# Patient Record
Sex: Male | Born: 1955 | Race: White | Hispanic: No | Marital: Married | Smoking: Never smoker
Health system: Southern US, Community
[De-identification: ages and names within clinical notes are randomized; demographics above are authoritative.]

## PROBLEM LIST (undated history)

## (undated) DIAGNOSIS — I7771 Dissection of carotid artery: Secondary | ICD-10-CM

## (undated) DIAGNOSIS — I10 Essential (primary) hypertension: Secondary | ICD-10-CM

## (undated) DIAGNOSIS — H269 Unspecified cataract: Secondary | ICD-10-CM

## (undated) DIAGNOSIS — E785 Hyperlipidemia, unspecified: Secondary | ICD-10-CM

## (undated) HISTORY — DX: Essential (primary) hypertension: I10

## (undated) HISTORY — DX: Hyperlipidemia, unspecified: E78.5

## (undated) HISTORY — DX: Unspecified cataract: H26.9

## (undated) HISTORY — PX: ROTATOR CUFF REPAIR: SHX139

## (undated) HISTORY — DX: Dissection of carotid artery: I77.71

## (undated) HISTORY — PX: OTHER SURGICAL HISTORY: SHX169

---

## 2011-04-07 ENCOUNTER — Inpatient Hospital Stay (HOSPITAL_COMMUNITY)
Admission: AD | Admit: 2011-04-07 | Discharge: 2011-04-08 | DRG: 300 | Disposition: A | Discharge status: Home / Self Care | Payer: PRIVATE HEALTH INSURANCE | Source: Other Acute Inpatient Hospital | Attending: Internal Medicine | Admitting: Internal Medicine

## 2011-04-07 DIAGNOSIS — Z7901 Long term (current) use of anticoagulants: Secondary | ICD-10-CM

## 2011-04-07 DIAGNOSIS — Z888 Allergy status to other drugs, medicaments and biological substances status: Secondary | ICD-10-CM

## 2011-04-07 DIAGNOSIS — I7771 Dissection of carotid artery: Principal | ICD-10-CM | POA: Diagnosis present

## 2011-04-07 DIAGNOSIS — I63239 Cerebral infarction due to unspecified occlusion or stenosis of unspecified carotid arteries: Secondary | ICD-10-CM

## 2011-04-07 DIAGNOSIS — E538 Deficiency of other specified B group vitamins: Secondary | ICD-10-CM | POA: Diagnosis present

## 2011-04-07 DIAGNOSIS — G909 Disorder of the autonomic nervous system, unspecified: Secondary | ICD-10-CM | POA: Diagnosis present

## 2011-04-07 DIAGNOSIS — Z7982 Long term (current) use of aspirin: Secondary | ICD-10-CM

## 2011-04-07 DIAGNOSIS — I10 Essential (primary) hypertension: Secondary | ICD-10-CM | POA: Diagnosis present

## 2011-04-08 ENCOUNTER — Inpatient Hospital Stay (HOSPITAL_COMMUNITY): Payer: PRIVATE HEALTH INSURANCE

## 2011-04-08 LAB — PROTIME-INR: INR: 0.93 (ref 0.00–1.49)

## 2011-04-08 LAB — CBC
HCT: 42.1 % (ref 39.0–52.0)
MCHC: 35.9 g/dL (ref 30.0–36.0)
Platelets: 289 10*3/uL (ref 150–400)
RDW: 12.6 % (ref 11.5–15.5)
WBC: 10.6 10*3/uL — ABNORMAL HIGH (ref 4.0–10.5)

## 2011-04-08 LAB — BASIC METABOLIC PANEL
BUN: 24 mg/dL — ABNORMAL HIGH (ref 6–23)
Chloride: 104 mEq/L (ref 96–112)
GFR calc Af Amer: >60 mL/min (ref >60)
GFR calc non Af Amer: >60 mL/min (ref >60)
Potassium: 3.9 mEq/L (ref 3.5–5.1)
Sodium: 140 mEq/L (ref 135–145)

## 2011-04-08 LAB — HEPARIN LEVEL (UNFRACTIONATED): Heparin Unfractionated: 0.47 IU/mL (ref 0.30–0.70)

## 2011-04-08 MED ORDER — IOHEXOL 350 MG/ML SOLN
50.0000 mL | Freq: Once | INTRAVENOUS | Status: AC | PRN
  Administered 2011-04-08: 50 mL via INTRAVENOUS

## 2011-04-08 NOTE — Op Note (Signed)
NAMEALDIN, DREES NO.:  [id number]  MEDICAL RECORD NO.:  [id number]  LOCATION:  3032                         FACILITY:  [facility]  PHYSICIAN:  Fransisco Hertz, MD       DATE OF BIRTH:  [birth year]  DATE OF PROCEDURE:  04/08/2011 DATE OF DISCHARGE:                              OPERATIVE REPORT   REQUESTING PHYSICIAN:  Dr. Betti Cruz one of the hospitalist at [facility].  REASON FOR CONSULTATION:  Possible right internal carotid artery dissection.  HISTORY OF PRESENT ILLNESS:  This is a 55 year old gentleman who a few days ago was having what he thought was upper respiratory symptoms on his right side.  He went to see a nurse and on some screening examination was found to have unequal pupils who was then sent to an ophthalmologist for evaluation.  They drew a ESR to rule-out a temporal arteritis.  At the same time, he had some headache on the right side and also a constricted pupil on the right side with some eyelid droop.  He notessince then he has noted some improvement in his eyelid droop and also variable amount of constriction in his right eye.  He does note his primary care physician also saw him to evaluate his right ear and is unclear exactly the diagnosis that his primary care physician decided on, but gave him some steroids, it appears then he was sent to the hospital for CT and eventual MRI.  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  I felt that as there was no vascular surgeon at the outside hospital, he would be appropriately transferred here to a medical service for further evaluation.  At this point, he denies any pain per se only a sense of fullness on the right side of his face and does not note any problems of his vision.  He denies any stroke or TIA symptoms, denies any history of monocular blindness or amaurosis fugax.  He denies  any facial droop being other than this recent episode and denies any hemiplegia.  He also denies any episodes previously of any expressive or receptive aphasia.  He does note his risk factor for atherosclerotic disease include hypertension, otherwise he denies any other medical problems.  PAST MEDICAL HISTORY:  Hypertension and B12 deficiency.  PAST SURGICAL HISTORY:  None.  MEDICATIONS:  B12 injections and Diovan and hydrochlorothiazide, aspirin, multivitamin and fish oil.  ALLERGIES:  LIPITOR which causes myalgia.  FAMILY HISTORY:  Father died of complications from cirrhosis.  Mother is currently healthy.  REVIEW OF SYSTEMS:  Was as listed above.  Otherwise, noted to be negative.  PHYSICAL EXAMINATION:  VITAL SIGNS:  His temperature was 97.9 with a blood pressure 122/73, heart rate of 57, respirations were 18 and satting 97% on room air. GENERAL:  Well-developed, well-nourished, in no apparent distress, alert and oriented x3. HEAD:  Normocephalic, atraumatic. ENT:  Oropharynx without any erythema or exudate.  Nares without any draining.  Ear is grossly intact. NECK:  Supple neck without any nuchal rigidity.  No JVD.  EYES:  Pupils were reactive to light both indirect and direct pupillary reactions.  The right eye is about 3-mm.  The left eye is about 5-mm. However, testing is appropriate and equal in both sides with testing. Extraocular movements were intact.  There is a slight eyelid droop on the right side. PULMONARY:  Symmetric expansion.  Good air movement.  No rales, rhonchi or wheezing. CARDIAC:  Regular rate and rhythm.  Normal S1 and S2.  No murmurs, rubs, thrills or gallops. VASCULAR:  Pelvises were palpable throughout all extremities.  Abdominal aorta could not be palpated.  Both carotids were palpable without any obvious bruits. ABDOMEN:  He had a soft abdomen nontender and nondistended.  No guarding, no rebound and no hepatosplenomegaly.  No costovertebral  angle tenderness.  No obvious masses. MUSCULOSKELETAL:  He had symmetric strength 5/5 throughout.  There is no ischemic changes in the extremity. NEUROLOGIC:  Cranial nerves II through XII were grossly intact.  The pupillary findings are as listed above.  The motor strength was intact as described above.  Sensation was grossly intact in all extremities. PSYCH:  Mood and affect were appropriate for his clinical situation. Judgment appears to be intact. SKIN:  No obvious rashes.  Extremities were as listed above. LYMPHATIC:  No cervical, axillary or inguinal lymphadenopathy were palpable.  I looked at the outside MRI which shows no obvious focal findings of stroke.  Also the MRA, there is underfilling of the right side and I can see the impression of possibly a dissection.  LABORATORY STUDIES:  CBC; white count of 10.6.  An H and H of 15.1 and 42.1, platelet count of 289.  Chemistries; sodium of 140, potassium of 3.9, chloride of 104, bicarb of 26, glucose of 110, BUN of 24 and creatinine of 0.74.  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  The exact etiology for this is unknown at this point.  He possibly has a right internal carotid artery dissection.  However, MRA is extremely vulnerable to artifact and also they did not interrogate the neck segment of the carotid artery. Subsequently, my recommendation rather than proceeding with a carotid and cerebral angiograms which carry a 1% stroke rate.  I would proceed forward instead with a CTA of neck and head to evaluate the entirety of this carotid artery from the chest all the way up into the brain and this should be able to fully determine the location of the started dissection if it is present and then also the distal extent of it. Also, should be able to fully evaluate and determine whether adequate perfusion is present.  If this patient is found to have a  carotid dissection, this is completely spontaneous as this patient has no trauma history and his only real risk factor for dissection is his hypertension which at this point is under control.  The literature at this point would recommend anticoagulation which should decrease the risk of a stroke due to occlusion from the dissection and then blood pressure control to prevent further extension of the dissection and an observation for about 6 months and repeat a study to see if the patient has healed the dissection.  As this patient's pressure is well controlled and his heart rate is actually below 60, I do not see any reason to change his blood pressure regimen.  Normally for a dissection, beta blockade is recommended due to keep the heart rate down and decrease the  pressure impulse which may further the dissection, but in this case he is already well-controlled in regards to his heart rate.  Further recommendations of course will be dependent on the CTA, so at this point we will continue to await those CTA.  The primary team has already started this gentleman on a heparin drip  in case he does have a carotid dissection, I think this is appropriate and final decision  regarding Lovenox bridge of Coumadin will depend on the final outcome  of that CTA. Thank you for the consult and we will continue follow on  this patient's case.     Fransisco Hertz, MD     BLC/MEDQ  D:  04/08/2011  T:  04/08/2011  Job:  [id number]  Electronically Signed by Leonides Sake MD on 04/08/2011 04:54:53 PM

## 2011-04-09 NOTE — H&P (Signed)
NAME:  Nathan Braun, Nathan Braun NO.:  [id number]  MEDICAL RECORD NO.:  [id number]  LOCATION:  3032                         FACILITY:  [facility]  PHYSICIAN:  Tarry Kos, MD       DATE OF BIRTH:  11-20-1955  DATE OF ADMISSION:  04/07/2011 DATE OF DISCHARGE:                             HISTORY & PHYSICAL   CHIEF COMPLAINT:  Abnormal pupil size  HISTORY OF PRESENT ILLNESS:  Mr. Guillotte is a 55 year old male who over the last several days has had unequalness in his pupil.  His right pupil has been smaller than his left pupil.  Couple of days ago, he started feeling some spasms in his right eye and was feeling some right ear dullness.  He was sent to see an ophthalmologist.  They ruled him out for temporal arthritis.  His sed rate was normal.  He saw his primary care physician who empirically gave him some steroids.  After 2 days, he was diagnosed with Horner syndrome.  He did not get any better, so he was sent to Neurology who subsequently obtained a CT and MRI of his brain, which revealed a right-sided carotid ICA dissection.  He was then transferred here from [facility] for observation overnight after speaking to Dr. Imogene Burn, vascular surgeon who recommended observation overnight and did not think he was a surgical candidate.  Mr. Danielski currently denies any pain.  He is not having chest pain and denies any eye pain.  He has ear fullness and dull achiness has gone away since he has received a steroid several days ago.  He says his blood pressure has been very well controlled over the last 3-4 days being in and out of doctor's office he says the highest.  His systolic has gotten is 130. He is newly diagnosed with hypertension approximately 6 months ago.  REVIEW OF SYSTEMS:  Otherwise negative.  PAST MEDICAL HISTORY:  Hypertension, B12 deficiency.  MEDICATIONS:  B12 shots, Diovan/HCT.  He takes a baby aspirin a day, multivitamin, fish oil.  ALLERGIES:  LIPITOR  causes muscle aches.  SOCIAL HISTORY:  Nonsmoker.  Occasional alcohol.  No other drug abuse.  FAMILY HISTORY:  His father died of complications of cirrhosis.  His mother has no vascular issues.  His brothers and sisters are healthy.  PHYSICAL EXAMINATION:  VITAL SIGNS:  Temperature is 98.1, pulse 70, respiration 20, blood pressure 138/91, 96% O2 sats on room air. GENERAL:  He is alert and oriented x4, in no apparent distress, cooperative, friendly. HEENT:  Extraocular muscles are intact.  His right eye is more constricted than his left, but reactive.  Oropharynx is clear.  Mucous membranes moist. NECK:  No JVD.  No carotid bruits. COR:  Regular rate and rhythm without murmurs, rubs, or gallops. CHEST:  Clear to auscultation bilaterally.  No wheeze, rhonchi, or rales. ABDOMEN:  Soft, nontender, nondistended.  Positive bowel sounds.  No hepatosplenomegaly. EXTREMITIES:  No clubbing, cyanosis, or edema. PSYCH:  Normal affect. NEURO:  No focal neurologic deficits.  Cranial nerves II through XII grossly intact other than the Honer's.  Sed rate 2 on April 05, 2011.  MRI shows distal right cervical ICA dissection beginning in  the severely tortuous segment and continues to the skull base.  Torturous left carotid and vertebral artery suggesting underlying hypertension, poor flow in the right ICA siphon related to the skull base.  No acute infarcts on MRI of the head.  This is from study from [place].  White count is 11.5, hemoglobin is 15.5.  PT 1. BMP normal.  LFTs normal.  Troponin negative.  Urinalysis negative.  ASSESSMENT AND PLAN:  This is a 55 year old male with a right carotid ICA dissection. 1. 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     Vascular surgery, Dr. Imogene Burn has already been made aware of the     patient and recommended anticoagulation.  I am going to place the     patient on a heparin drip if okay with Dr. Imogene Burn and have staff call     Dr.  Imogene Burn to let him know the patient is here in order to see him     and do his consultation.  Further recommendations per Dr. Imogene Burn. 2. Hypertension.  This is stable.  He has had got good blood pressure     control at this time.  Parameters have been put on the chart.     Obviously, he needs further tight blood pressure control. 3. The patient is full code.  Further recommendation depending on     overall hospital course.          ______________________________ Tarry Kos, MD     RD/MEDQ  D:  04/07/2011  T:  04/07/2011  Job:  [id number]  Electronically Signed by Tarry Kos MD on 04/09/2011 10:46:50 AM

## 2011-04-10 ENCOUNTER — Other Ambulatory Visit (HOSPITAL_COMMUNITY): Payer: Self-pay | Admitting: Internal Medicine

## 2011-04-10 DIAGNOSIS — I7771 Dissection of carotid artery: Secondary | ICD-10-CM

## 2011-04-10 DIAGNOSIS — G902 Horner's syndrome: Secondary | ICD-10-CM

## 2011-04-10 NOTE — Discharge Summary (Signed)
NAMESENICA, CRALL NO.:  [id number]  MEDICAL RECORD NO.:  [id number]  LOCATION:                                 FACILITY:  PHYSICIAN:  Andreas Blower, MD       DATE OF BIRTH:  01-16-1956  DATE OF ADMISSION:  04/07/2011 DATE OF DISCHARGE:  04/08/2011                              DISCHARGE SUMMARY   PRIMARY CARE PHYSICIAN:  Feliciana Rossetti, MD at [place], [place].  DISCHARGE DIAGNOSES: 1. 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 2. 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 3. Hypertension, well controlled. 4. History of B12 deficiency.  DISCHARGE MEDICATIONS: 1. Warfarin 7.5 mg p.o. daily. 2. Lovenox 80 mg subcu twice daily. 3. Valsartan/hydrochlorothiazide 160/12.5 mg p.o. daily. 4. Fish oil 1000 mg p.o. daily. 5. Aspirin 81 mg p.o. daily. 6. Sodium chloride nasal over-the-counter q.8 h. p.r.n. 7. Vitamin D 1000 units p.o. daily.  BRIEF ADMITTING HISTORY AND PHYSICAL:  Mr. Barga is a 55 year old Caucasian male with history of hypertension and B12 deficiency, who presented initially to [facility] with abnormal pupil size and was diagnosed with having right-sided carotid ICA dissection, as a result was transferred to [facility] for further evaluation.  RADIOLOGY/IMAGING:  The patient had a CT of the head and neck, which showed severe dissection of right internal carotid artery at the level of skull base with marked narrowing of the lumen and markedly diminished antegrade flow.  The right anterior and middle cerebral arteries are patent.  No acute infarct or hemorrhage is seen.  LABS:  CBC shows a white count of 10.6, hemoglobin 15.1, hematocrit 42.1, platelet count 286.  INR 0.93.  Electrolytes normal with a BUN of 24, creatinine 0.74.  HOSPITAL COURSE BY PROBLEM: 1. Right ICA dissection.  Initially, the patient was started on     heparin.  Dr. Imogene Burn from Vascular Surgery evaluated the patient.     Given that  there was question that on the MRI of dissection could     have been an artifact Dr. Imogene Burn recommended getting a repeat head     and neck CT with contrast to confirm the right ICA dissection.     Repeat imaging did confirm the right ICA.  The patient initially     on admission was started on heparin which was transitioned to     Lovenox.  The patient will be anticoagulated on Coumadin.  Likely,     the patient will need anticoagulation for about 6 months.     Dr. Imogene Burn also recommended the patient follow up with Dr. Corliss Skains     with [facility]iology for further management. 2. Hypertension, well controlled.  The patient will continue home     medications. 3. 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  DISPOSITION AND FOLLOWUP:  The patient was instructed to follow up with Dr. Shary Decamp, his primary care physician, in 1 week.  The patient was also instructed to have blood work checked in Dr. Anders Grant office on April 11, 2011 for PT and INR and further monitoring of his Coumadin thereafter.  The patient is to follow up with Dr. Corliss Skains with [facility].  The patient was instructed to call to make the appointment.  The patient was given the telephone number.  Time spent on discharge talking to the patient and coordinating care was 45 minutes.     Andreas Blower, MD     SR/MEDQ  D:  04/08/2011  T:  04/08/2011  Job:  [id number]  Electronically Signed by Wardell Heath Aalyssa Elderkin  on 04/10/2011 06:17:55 PM

## 2011-04-11 ENCOUNTER — Ambulatory Visit (HOSPITAL_COMMUNITY)
Admission: RE | Admit: 2011-04-11 | Discharge: 2011-04-11 | Disposition: A | Discharge status: Home / Self Care | Payer: PRIVATE HEALTH INSURANCE | Source: Ambulatory Visit | Attending: Internal Medicine | Admitting: Internal Medicine

## 2011-04-11 DIAGNOSIS — G902 Horner's syndrome: Secondary | ICD-10-CM

## 2011-04-11 DIAGNOSIS — I7771 Dissection of carotid artery: Secondary | ICD-10-CM

## 2011-08-29 ENCOUNTER — Telehealth (HOSPITAL_COMMUNITY): Payer: Self-pay

## 2011-10-03 NOTE — Telephone Encounter (Signed)
Contacts         Type  Contact  Phone    08/29/2011 10:28 AM  Phone ([address]  Nathan, Braun (Self)  [phone] (M)    Completed- Per Dr. Corliss Skains reviewing Scan Pt is cleared to resume reg activities

## 2012-09-04 IMAGING — CT CT ANGIO HEAD
1 of 11 series · 4 of 33 positions shown · IV contrast (CONTRAST)
Comparison: None.

CTA NECK

CLINICAL DATA: Headache, unequal pupils.  Rule out right internal
carotid artery dissection

CT ANGIOGRAPHY HEAD AND NECK
TECHNIQUE: Multidetector CT imaging of the head and neck was
performed using the standard protocol during bolus administration
of intravenous contrast.  Multiplanar CT image reconstructions
including MIPs were obtained to evaluate the vascular anatomy.
Carotid stenosis measurements (when applicable) are obtained
utilizing NASCET criteria, using the distal internal carotid
diameter as the denominator.
Contrast:  50 ml Omnipaque 350 IV

[mpr, ax 1x1 mpr, axial · axial · 0.39mm/px · z∈[+98,+329]mm · 4 of 386 slices shown]
[im 78/386  soft-tissue]
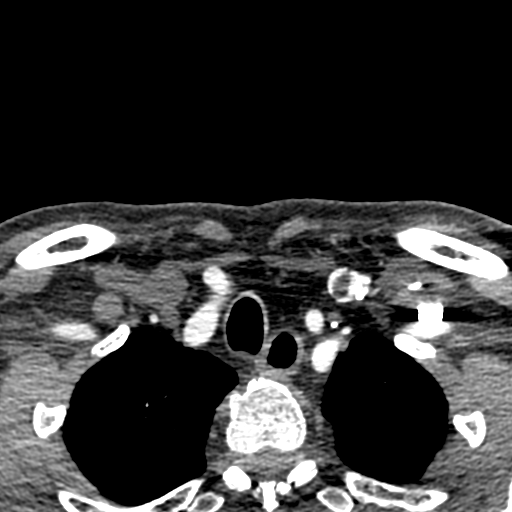
[im 155/386  bone]
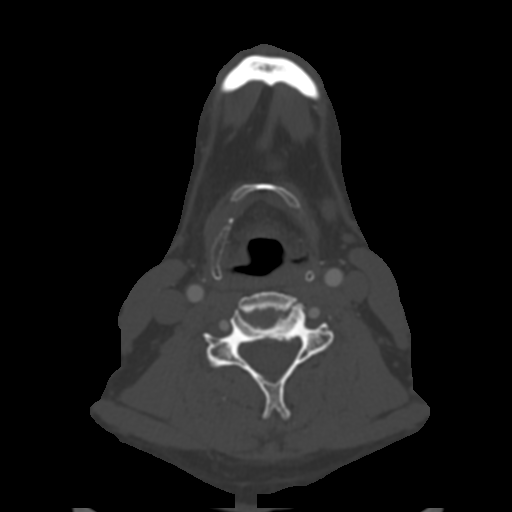
[im 232/386  soft-tissue]
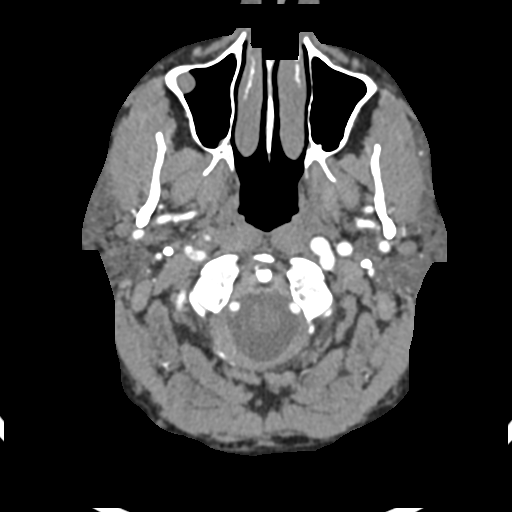
[im 309/386  bone]
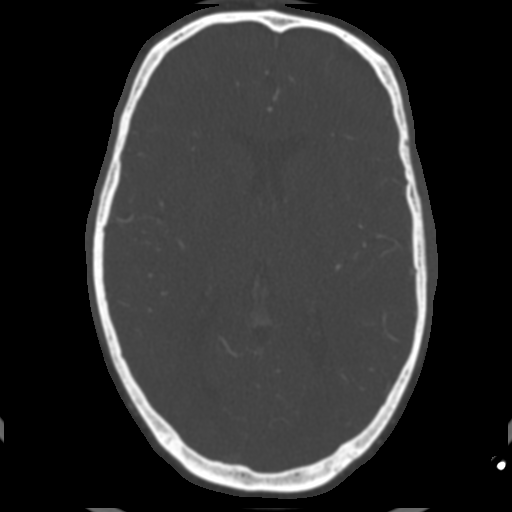

[4 of 33 positions shown; findings below may reference images not displayed]

FINDINGS: There is a dissection of the right internal carotid
artery just below the skull base.  The lumen is markedly narrowed
through the dissection with very low antegrade flow intracranially.
Right cavernous carotid shows less enhancement than seen on the
left due to decreased flow.

The left internal carotid artery is normal.  The carotid
bifurcation is normal.  Both vertebral arteries are normal and
appear patent.

No mass or adenopathy is present in the neck.

 Review of the MIP images confirms the above findings.
IMPRESSION: Severe dissection of the right internal carotid artery at the level
of the skull base with marked narrowing of the lumen and markedly
diminished antegrade flow.  This could be a cause of the patient's
Nuferi Lalele syndrome.

CTA HEAD
FINDINGS: Ventricle size is normal.  Negative for acute or chronic
infarct.  Negative for hemorrhage mass or edema in the brain.

Calvarium is intact.  Mild chronic sinusitis.

Both vertebral arteries are patent to the basilar.  PICA is patent
bilaterally.  Superior cerebellar and posterior cerebral arteries
are patent bilaterally without stenosis.

Diminished flow in the right cavernous carotid due to the right
internal carotid artery dissection described above with markedly
diminished antegrade flow within the dissection.  Left cavernous
carotid is widely patent.  Anterior  and middle cerebral arteries
are patent bilaterally.  No intracranial occlusion is identified.

 Review of the MIP images confirms the above findings.
IMPRESSION: Carotid dissection of the right internal carotid artery at the
skull base   with  diminished antegrade flow.  The right anterior
and middle cerebral arteries are patent.     No acute infarct or
hemorrhage.

Critical Value/emergent results were called by telephone at the
time of interpretation on 04/08/2011  at 6838 hours  to  Dr. Mouxz,
who verbally acknowledged these results.

## 2019-02-14 ENCOUNTER — Encounter: Payer: Self-pay | Admitting: Gastroenterology

## 2019-03-21 ENCOUNTER — Ambulatory Visit (AMBULATORY_SURGERY_CENTER): Payer: Self-pay

## 2019-03-21 ENCOUNTER — Other Ambulatory Visit: Payer: Self-pay

## 2019-03-21 VITALS — Ht 72.0 in | Wt 189.6 lb

## 2019-03-21 DIAGNOSIS — Z1211 Encounter for screening for malignant neoplasm of colon: Secondary | ICD-10-CM

## 2019-03-21 MED ORDER — NA SULFATE-K SULFATE-MG SULF 17.5-3.13-1.6 GM/177ML PO SOLN: 1.0000 | Freq: Once | ORAL | 0 refills | Status: AC

## 2019-03-21 NOTE — Progress Notes (Signed)
Denies allergies to eggs or soy products. Denies complication of anesthesia or sedation. Denies use of weight loss medication. Denies use of O2.   Emmi instructions given for colonoscopy.  A 15.00 coupon for Suprep was given to the patient.  

## 2019-04-01 ENCOUNTER — Encounter: Payer: Self-pay | Admitting: Gastroenterology

## 2019-04-10 ENCOUNTER — Telehealth: Payer: Self-pay

## 2019-04-10 NOTE — Telephone Encounter (Signed)
Covid-19 screening questions   Do you now or have you had a fever in the last 14 days?  Do you have any respiratory symptoms of shortness of breath or cough now or in the last 14 days?  Do you have any family members or close contacts with diagnosed or suspected Covid-19 in the past 14 days?  Have you been tested for Covid-19 and found to be positive?       

## 2019-04-10 NOTE — Telephone Encounter (Signed)
Patient returned call and answered NO to the Covid-19 screening questions ° °

## 2019-04-11 ENCOUNTER — Ambulatory Visit (AMBULATORY_SURGERY_CENTER): Payer: PRIVATE HEALTH INSURANCE | Admitting: Gastroenterology

## 2019-04-11 ENCOUNTER — Other Ambulatory Visit: Payer: Self-pay | Admitting: Gastroenterology

## 2019-04-11 ENCOUNTER — Other Ambulatory Visit: Payer: Self-pay

## 2019-04-11 ENCOUNTER — Encounter: Payer: Self-pay | Admitting: Gastroenterology

## 2019-04-11 VITALS — BP 114/74 | HR 59 | Temp 98.1°F | Resp 18 | Ht 72.0 in | Wt 189.0 lb

## 2019-04-11 DIAGNOSIS — Z1211 Encounter for screening for malignant neoplasm of colon: Secondary | ICD-10-CM | POA: Diagnosis present

## 2019-04-11 DIAGNOSIS — D123 Benign neoplasm of transverse colon: Secondary | ICD-10-CM | POA: Diagnosis not present

## 2019-04-11 MED ORDER — SODIUM CHLORIDE 0.9 % IV SOLN: 500.0000 mL | Freq: Once | INTRAVENOUS | Status: DC

## 2019-04-11 NOTE — Progress Notes (Signed)
Pt's states no medical or surgical changes since previsit or office visit.  Vs by [facility] by [place]

## 2019-04-11 NOTE — Op Note (Signed)
Nathan Braun: Nathan Braun Procedure Date: 04/11/2019 8:56 AM MRN: [id number] Endoscopist: Jackquline Denmark , MD Age: 63 Referring MD:  Date of Birth: [birth year] Gender: Male Account #: [id number] Procedure:                Colonoscopy Indications:              High risk colon cancer surveillance: Personal                            history of colonic polyps Medicines:                Monitored Anesthesia Care Procedure:                Pre-Anesthesia Assessment:                           - Prior to the procedure, a History and Physical                            was performed, and patient medications and                            allergies were reviewed. The patient's tolerance of                            previous anesthesia was also reviewed. The risks                            and benefits of the procedure and the sedation                            options and risks were discussed with the patient.                            All questions were answered, and informed consent                            was obtained. Prior Anticoagulants: The patient has                            taken no previous anticoagulant or antiplatelet                            agents. ASA Grade Assessment: II - A patient with                            mild systemic disease. After reviewing the risks                            and benefits, the patient was deemed in                            satisfactory condition to undergo the procedure.  After obtaining informed consent, the colonoscope                            was passed under direct vision. Throughout the                            procedure, the patient's blood pressure, pulse, and                            oxygen saturations were monitored continuously. The                            Colonoscope was introduced through the anus and                            advanced to the 2 cm into the ileum. The                            colonoscopy was performed without difficulty. The                            patient tolerated the procedure well. The quality                            of the bowel preparation was good. The terminal                            ileum, ileocecal valve, appendiceal orifice, and                            rectum were photographed. Scope In: 8:59:21 AM Scope Out: 9:07:47 AM Scope Withdrawal Time: 0 hours 6 minutes 28 seconds  Total Procedure Duration: 0 hours 8 minutes 26 seconds  Findings:                 A 10 mm polyp was found in the mid ascending colon.                            The polyp was sessile. The polyp was removed with a                            hot snare. Resection and retrieval were complete.                            Estimated blood loss: none.                           A few medium-mouthed diverticula were found in the                            sigmoid colon.                           Non-bleeding internal hemorrhoids were found during  retroflexion. The hemorrhoids were small.                           The terminal ileum appeared normal.                           The exam was otherwise without abnormality on                            direct and retroflexion views. Complications:            No immediate complications. Estimated Blood Loss:     Estimated blood loss: none. Impression:               - Colonic polyp S/P polypectomy.                           - Mild sigmoid diverticulosis.                           - Otherwise normal colonoscopy to TI. Recommendation:           - Patient has a contact number available for                            emergencies. The signs and symptoms of potential                            delayed complications were discussed with the                            patient. Return to normal activities tomorrow.                            Written discharge instructions were provided to the                             patient.                           - Resume previous high-fiber diet.                           - Continue present medications.                           - No aspirin, ibuprofen, naproxen, or other                            non-steroidal anti-inflammatory drugs for 5 days                            after polyp removal.                           - Await pathology results.                           -  Repeat colonoscopy for surveillance based on                            pathology results.                           - Return to GI clinic PRN.                           - D/W Mechele Claude. Jackquline Denmark, MD 04/11/2019 9:15:20 AM This report has been signed electronically.

## 2019-04-11 NOTE — Progress Notes (Signed)
Called to room to assist during endoscopic procedure.  Patient ID and intended procedure confirmed with present staff. Received instructions for my participation in the procedure from the performing physician.  

## 2019-04-11 NOTE — Progress Notes (Signed)
Report to PACU, RN, vss, BBS= Clear.  

## 2019-04-11 NOTE — Patient Instructions (Signed)
YOU HAD AN ENDOSCOPIC PROCEDURE TODAY AT THE [facility]:   Refer to the procedure report that was given to you for any specific questions about what was found during the examination.  If the procedure report does not answer your questions, please call your gastroenterologist to clarify.  If you requested that your care partner not be given the details of your procedure findings, then the procedure report has been included in a sealed envelope for you to review at your convenience later.  YOU SHOULD EXPECT: Some feelings of bloating in the abdomen. Passage of more gas than usual.  Walking can help get rid of the air that was put into your GI tract during the procedure and reduce the bloating. If you had a lower endoscopy (such as a colonoscopy or flexible sigmoidoscopy) you may notice spotting of blood in your stool or on the toilet paper. If you underwent a bowel prep for your procedure, you may not have a normal bowel movement for a few days.  Please Note:  You might notice some irritation and congestion in your nose or some drainage.  This is from the oxygen used during your procedure.  There is no need for concern and it should clear up in a day or so.  SYMPTOMS TO REPORT IMMEDIATELY:   Following lower endoscopy (colonoscopy or flexible sigmoidoscopy):  Excessive amounts of blood in the stool  Significant tenderness or worsening of abdominal pains  Swelling of the abdomen that is new, acute  Fever of 100F or higher  For urgent or emergent issues, a gastroenterologist can be reached at any hour by calling [phone].   DIET:  We do recommend a small meal at first, but then you may proceed to your regular diet.  Drink plenty of fluids but you should avoid alcoholic beverages for 24 hours.  ACTIVITY:  You should plan to take it easy for the rest of today and you should NOT DRIVE or use heavy machinery until tomorrow (because of the sedation medicines used during the test).     FOLLOW UP: Our staff will call the number listed on your records 48-72 hours following your procedure to check on you and address any questions or concerns that you may have regarding the information given to you following your procedure. If we do not reach you, we will leave a message.  We will attempt to reach you two times.  During this call, we will ask if you have developed any symptoms of COVID 19. If you develop any symptoms (ie: fever, flu-like symptoms, shortness of breath, cough etc.) before then, please call [phone].  If you test positive for Covid 19 in the 2 weeks post procedure, please call and report this information to us.    If any biopsies were taken you will be contacted by phone or by letter within the next 1-3 weeks.  Please call us at [phone] if you have not heard about the biopsies in 3 weeks.    SIGNATURES/CONFIDENTIALITY: You and/or your care partner have signed paperwork which will be entered into your electronic medical record.  These signatures attest to the fact that that the information above on your After Visit Summary has been reviewed and is understood.  Full responsibility of the confidentiality of this discharge information lies with you and/or your care-partner. 

## 2019-04-15 ENCOUNTER — Telehealth: Payer: Self-pay | Admitting: *Deleted

## 2019-04-15 NOTE — Telephone Encounter (Signed)
1. Have you developed a fever since your procedure? no  2.   Have you had an respiratory symptoms (SOB or cough) since your procedure? no  3.   Have you tested positive for COVID 19 since your procedure no  4.   Have you had any family members/close contacts diagnosed with the COVID 19 since your procedure?  no   If yes to any of these questions please route to Joylene John, RN and Alphonsa Gin, Therapist, sports.     Follow up Call-  Call back number 04/11/2019  Post procedure Call Back phone  # [phone]  Permission to leave phone message Yes  Some recent data might be hidden     Patient questions:  Do you have a fever, pain , or abdominal swelling? No. Pain Score  0 *  Have you tolerated food without any problems? Yes.    Have you been able to return to your normal activities? Yes.    Do you have any questions about your discharge instructions: Diet   No. Medications  No. Follow up visit  No.  Do you have questions or concerns about your Care? No.  Actions: * If pain score is 4 or above: No action needed, pain <4.

## 2019-04-20 ENCOUNTER — Encounter: Payer: Self-pay | Admitting: Gastroenterology

## 2022-05-12 ENCOUNTER — Encounter: Payer: Self-pay | Admitting: Gastroenterology

## 2022-07-31 ENCOUNTER — Ambulatory Visit (AMBULATORY_SURGERY_CENTER): Payer: PRIVATE HEALTH INSURANCE

## 2022-07-31 VITALS — Ht 72.0 in | Wt 186.0 lb

## 2022-07-31 DIAGNOSIS — Z83719 Family history of colon polyps, unspecified: Secondary | ICD-10-CM

## 2022-07-31 DIAGNOSIS — Z86010 Personal history of colonic polyps: Secondary | ICD-10-CM

## 2022-07-31 MED ORDER — PLENVU 140 G PO SOLR: 1.0000 | ORAL | Status: DC

## 2022-07-31 MED ORDER — NA SULFATE-K SULFATE-MG SULF 17.5-3.13-1.6 GM/177ML PO SOLN: 1.0000 | Freq: Once | ORAL | 0 refills | Status: DC

## 2022-07-31 NOTE — Progress Notes (Signed)
No egg or soy allergy known to patient  No issues known to pt with past sedation with any surgeries or procedures Patient denies ever being told they had issues or difficulty with intubation  No FH of Malignant Hyperthermia Pt is not on diet pills Pt is not on  home 02  Pt is not on blood thinners  Pt denies issues with constipation  No A fib or A flutter Have any cardiac testing pending--no Pt instructed to use Singlecare.com or GoodRx for a price reduction on prep   

## 2022-08-08 ENCOUNTER — Telehealth: Payer: Self-pay | Admitting: Gastroenterology

## 2022-08-08 NOTE — Telephone Encounter (Signed)
LMOM for pt to call back to answer questions

## 2022-08-08 NOTE — Telephone Encounter (Signed)
Incoming call from patient returning Kristen's call.

## 2022-08-08 NOTE — Telephone Encounter (Signed)
Pt states he received Suprep at pharmacy.  New instructions for Suprep sent via [facility].  All questions answered

## 2022-08-08 NOTE — Telephone Encounter (Signed)
Patient needs a call regarding his prep.  He has instructions for one, but the pharmacist gave him something else.  He just needs to talk to you and get some clarification.  Thank you.

## 2022-08-14 ENCOUNTER — Encounter: Payer: Self-pay | Admitting: Gastroenterology

## 2022-08-14 ENCOUNTER — Ambulatory Visit (AMBULATORY_SURGERY_CENTER): Payer: Managed Care, Other (non HMO) | Admitting: Gastroenterology

## 2022-08-14 VITALS — BP 123/72 | HR 56 | Temp 97.5°F | Resp 12 | Ht 72.0 in | Wt 186.0 lb

## 2022-08-14 DIAGNOSIS — D124 Benign neoplasm of descending colon: Secondary | ICD-10-CM | POA: Diagnosis not present

## 2022-08-14 DIAGNOSIS — Z86010 Personal history of colonic polyps: Secondary | ICD-10-CM

## 2022-08-14 DIAGNOSIS — Z09 Encounter for follow-up examination after completed treatment for conditions other than malignant neoplasm: Secondary | ICD-10-CM | POA: Diagnosis present

## 2022-08-14 MED ORDER — SODIUM CHLORIDE 0.9 % IV SOLN: 500.0000 mL | Freq: Once | INTRAVENOUS | Status: DC

## 2022-08-14 NOTE — Patient Instructions (Signed)
Handouts/information given for polyps, high fiber diet and diverticulosis.  Repeat colonoscopy in 5 years.  Resume previous diet, resume previous medications.  YOU HAD AN ENDOSCOPIC PROCEDURE TODAY AT [facility]:   Refer to the procedure report that was given to you for any specific questions about what was found during the examination.  If the procedure report does not answer your questions, please call your gastroenterologist to clarify.  If you requested that your care partner not be given the details of your procedure findings, then the procedure report has been included in a sealed envelope for you to review at your convenience later.  YOU SHOULD EXPECT: Some feelings of bloating in the abdomen. Passage of more gas than usual.  Walking can help get rid of the air that was put into your GI tract during the procedure and reduce the bloating. If you had a lower endoscopy (such as a colonoscopy or flexible sigmoidoscopy) you may notice spotting of blood in your stool or on the toilet paper. If you underwent a bowel prep for your procedure, you may not have a normal bowel movement for a few days.  Please Note:  You might notice some irritation and congestion in your nose or some drainage.  This is from the oxygen used during your procedure.  There is no need for concern and it should clear up in a day or so.  SYMPTOMS TO REPORT IMMEDIATELY:  Following lower endoscopy (colonoscopy or flexible sigmoidoscopy):  Excessive amounts of blood in the stool  Significant tenderness or worsening of abdominal pains  Swelling of the abdomen that is new, acute  Fever of 100F or higher  For urgent or emergent issues, a gastroenterologist can be reached at any hour by calling [phone]. Do not use MyChart messaging for urgent concerns.   DIET:  We do recommend a small meal at first, but then you may proceed to your regular diet.  Drink plenty of fluids but you should avoid alcoholic  beverages for 24 hours.  ACTIVITY:  You should plan to take it easy for the rest of today and you should NOT DRIVE or use heavy machinery until tomorrow (because of the sedation medicines used during the test).    FOLLOW UP: Our staff will call the number listed on your records the next business day following your procedure.  We will call around 7:15- 8:00 am to check on you and address any questions or concerns that you may have regarding the information given to you following your procedure. If we do not reach you, we will leave a message.     If any biopsies were taken you will be contacted by phone or by letter within the next 1-3 weeks.  Please call us at [phone] if you have not heard about the biopsies in 3 weeks.    SIGNATURES/CONFIDENTIALITY: You and/or your care partner have signed paperwork which will be entered into your electronic medical record.  These signatures attest to the fact that that the information above on your After Visit Summary has been reviewed and is understood.  Full responsibility of the confidentiality of this discharge information lies with you and/or your care-partner.

## 2022-08-14 NOTE — Op Note (Signed)
[place] Patient Name: Nathan Braun Procedure Date: 08/14/2022 10:33 AM MRN: [id number] Endoscopist: Jackquline Denmark , MD, [id number] Age: 67 Referring MD:  Date of Birth: 12-Feb-1956 Gender: Male Account #: [id number] Procedure:                Colonoscopy Indications:              High risk colon cancer surveillance: Personal                            history of colonic polyps Medicines:                Monitored Anesthesia Care Procedure:                Pre-Anesthesia Assessment:                           - Prior to the procedure, a History and Physical                            was performed, and patient medications and                            allergies were reviewed. The patient's tolerance of                            previous anesthesia was also reviewed. The risks                            and benefits of the procedure and the sedation                            options and risks were discussed with the patient.                            All questions were answered, and informed consent                            was obtained. Prior Anticoagulants: The patient has                            taken no anticoagulant or antiplatelet agents. ASA                            Grade Assessment: II - A patient with mild systemic                            disease. After reviewing the risks and benefits,                            the patient was deemed in satisfactory condition to                            undergo the procedure.  After obtaining informed consent, the colonoscope                            was passed under direct vision. Throughout the                            procedure, the patient's blood pressure, pulse, and                            oxygen saturations were monitored continuously. The                            Colonoscope was introduced through the anus and                            advanced to the the cecum, identified  by                            appendiceal orifice and ileocecal valve. The                            colonoscopy was performed without difficulty. The                            patient tolerated the procedure well. The quality                            of the bowel preparation was good. The ileocecal                            valve, appendiceal orifice, TI and rectum were                            photographed. Scope In: 10:38:40 AM Scope Out: 10:49:54 AM Scope Withdrawal Time: 0 hours 8 minutes 13 seconds  Total Procedure Duration: 0 hours 11 minutes 14 seconds  Findings:                 Two sessile polyps were found in the proximal                            descending colon and mid descending colon. The                            polyps were 4 to 6 mm in size. These polyps were                            removed with a cold snare. Resection and retrieval                            were complete.                           Multiple medium-mouthed diverticula were found in  the sigmoid colon.                           Non-bleeding internal hemorrhoids were found during                            retroflexion. The hemorrhoids were small and Grade                            I (internal hemorrhoids that do not prolapse).                           The exam was otherwise without abnormality on                            direct and retroflexion views. Complications:            No immediate complications. Estimated Blood Loss:     Estimated blood loss: none. Impression:               - Two 4 to 6 mm polyps in the proximal descending                            colon and in the mid descending colon, removed with                            a cold snare. Resected and retrieved.                           - Moderate sigmoid diverticulosis.                           - Non-bleeding internal hemorrhoids.                           - The examination was otherwise normal on  direct                            and retroflexion views. Recommendation:           - Patient has a contact number available for                            emergencies. The signs and symptoms of potential                            delayed complications were discussed with the                            patient. Return to normal activities tomorrow.                            Written discharge instructions were provided to the                            patient.                           -  High fiber diet.                           - Continue present medications.                           - Await pathology results.                           - Repeat colonoscopy in 5 years for surveillance                            based on pathology results and due to history of                            advanced polyps.                           - The findings and recommendations were discussed                            with the patient's family. Jackquline Denmark, MD 08/14/2022 10:53:38 AM This report has been signed electronically.

## 2022-08-14 NOTE — Progress Notes (Signed)
Report to PACU, RN, vss, BBS= Clear.  

## 2022-08-14 NOTE — Progress Notes (Signed)
[facility] History and Physical   Primary Care Physician:  Raina Mina., MD   Reason for Procedure:    history of polyps  Plan:     colonoscopy     HPI: Nathan Braun is a 67 y.o. 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 Authorizing Provider  aspirin EC 81 MG tablet Take 81 mg by mouth daily.   Yes [provider]  Naftifine HCl 2 % CREA Apply topically daily as needed.   Yes [provider]  OVER THE COUNTER MEDICATION Vitamin D 3 one capsule daily.   Yes [provider]  rosuvastatin (CRESTOR) 10 MG tablet Take 10 mg by mouth daily.   Yes [provider]  valsartan (DIOVAN) 320 MG tablet Take 320 mg by mouth daily.   Yes [provider]  OVER THE COUNTER MEDICATION B-12 injection once monthly.    [provider]  tadalafil (CIALIS) 5 MG tablet Take 5 mg by mouth daily as needed for erectile dysfunction.    [provider]    Current Outpatient Medications  Medication Sig Dispense Refill   aspirin EC 81 MG tablet Take 81 mg by mouth daily.     Naftifine HCl 2 % CREA Apply topically daily as needed.     OVER THE COUNTER MEDICATION Vitamin D 3 one capsule daily.     rosuvastatin (CRESTOR) 10 MG tablet Take 10 mg by mouth daily.     valsartan (DIOVAN) 320 MG tablet Take 320 mg by mouth daily.     OVER THE COUNTER MEDICATION B-12 injection once monthly.     tadalafil (CIALIS) 5 MG tablet Take 5 mg by mouth daily as needed for erectile dysfunction.     No current facility-administered medications for this visit.    Allergies as of 08/14/2022 - Review Complete 08/14/2022  Allergen Reaction Noted   Lipitor [atorvastatin calcium]  03/21/2019    Family  History  Problem Relation Age of Onset   Breast cancer Mother    Colon polyps Mother    Colon polyps Father    Colon cancer Neg Hx    Esophageal cancer Neg Hx    Rectal cancer Neg Hx    Stomach cancer Neg Hx     Social History   Socioeconomic History   Marital status: Married    Spouse name: Not on file   Number of children: Not on file   Years of education: Not on file   Highest education level: Not on file  Occupational History   Not on file  Tobacco Use   Smoking status: Never   Smokeless tobacco: Never  Substance and Sexual Activity   Alcohol use: Yes    Alcohol/week: 2.0 standard drinks of alcohol    Types: 1 Glasses of wine, 1 Cans of beer per week    Comment: Socially   Drug use: Never   Sexual activity: Not on file  Other Topics Concern   Not on file  Social History Narrative   Not on file   Social Determinants of Health   Financial Resource Strain: Not on file  Food Insecurity: Not on file  Transportation Needs: Not on file  Physical Activity: Not on file  Stress: Not on file  Social Connections: Not on file  Intimate Partner Violence: Not on file    Review of Systems: Positive for none All other review of systems negative except as mentioned in the HPI.  Physical Exam: Vital signs in last 24 hours: '@VSRANGES'$ @   General:   Alert,  Well-developed, well-nourished, pleasant and cooperative in NAD Lungs:  Clear throughout to auscultation.   Heart:  Regular rate and rhythm; no murmurs, clicks, rubs,  or gallops. Abdomen:  Soft, nontender and nondistended. Normal bowel sounds.   Neuro/Psych:  Alert and cooperative. Normal mood and affect. A and O x 3    No significant changes were identified.  The patient continues to be an appropriate candidate for the planned procedure and anesthesia.   Carmell Austria, MD. [facility] 08/14/2022 10:28 AM@

## 2022-08-14 NOTE — Progress Notes (Signed)
Called to room to assist during endoscopic procedure.  Patient ID and intended procedure confirmed with present staff. Received instructions for my participation in the procedure from the performing physician.  

## 2022-08-15 ENCOUNTER — Telehealth: Payer: Self-pay

## 2022-08-15 NOTE — Telephone Encounter (Signed)
  Follow up Call-     08/14/2022   10:15 AM 08/14/2022   10:13 AM  Call back number  Post procedure Call Back phone  # [phone]   Permission to leave phone message  Yes     Patient questions:  Do you have a fever, pain , or abdominal swelling? No. Pain Score  0 *  Have you tolerated food without any problems? Yes.    Have you been able to return to your normal activities? Yes.    Do you have any questions about your discharge instructions: Diet   No. Medications  No. Follow up visit  No.  Do you have questions or concerns about your Care? No.  Actions: * If pain score is 4 or above: No action needed, pain <4.

## 2022-08-19 ENCOUNTER — Encounter: Payer: Self-pay | Admitting: Gastroenterology

## 2024-01-14 ENCOUNTER — Ambulatory Visit (INDEPENDENT_AMBULATORY_CARE_PROVIDER_SITE_OTHER)
Admit: 2024-01-14 | Discharge: 2024-01-14 | Disposition: A | Discharge status: Home / Self Care | Attending: Family Medicine | Admitting: Family Medicine

## 2024-01-14 ENCOUNTER — Encounter (HOSPITAL_BASED_OUTPATIENT_CLINIC_OR_DEPARTMENT_OTHER): Payer: Self-pay | Admitting: Emergency Medicine

## 2024-01-14 ENCOUNTER — Ambulatory Visit (HOSPITAL_BASED_OUTPATIENT_CLINIC_OR_DEPARTMENT_OTHER): Payer: Self-pay | Admitting: Family Medicine

## 2024-01-14 ENCOUNTER — Ambulatory Visit (HOSPITAL_BASED_OUTPATIENT_CLINIC_OR_DEPARTMENT_OTHER)
Admission: EM | Admit: 2024-01-14 | Discharge: 2024-01-14 | Disposition: A | Discharge status: Home / Self Care | Attending: Family Medicine | Admitting: Family Medicine

## 2024-01-14 DIAGNOSIS — W19XXXA Unspecified fall, initial encounter: Secondary | ICD-10-CM

## 2024-01-14 DIAGNOSIS — M25531 Pain in right wrist: Secondary | ICD-10-CM

## 2024-01-14 DIAGNOSIS — W11XXXA Fall on and from ladder, initial encounter: Secondary | ICD-10-CM

## 2024-01-14 NOTE — ED Triage Notes (Signed)
 Pt st's he fell off a step stool yesterday   C/O pain to right wrist.  Pt wearing his own wrist splint

## 2024-01-14 NOTE — Discharge Instructions (Signed)
 I am not seeing anything obvious on the xray.  If the radiologist reads anything different I will call you.  Continue to wear the splint. Ice the wrist.  Follow up as needed.

## 2024-01-15 NOTE — ED Provider Notes (Signed)
 [facility]    CSN: [id number] Arrival date & time: 01/14/24  0828      History   Chief Complaint Chief Complaint  Patient presents with   Wrist Pain    HPI Nathan Braun is a 68 y.o. male.   Patient is a 68 year old male who presents today with wrist pain.  Reports that yesterday he fell off a stepstool and landed on the wrist.  He was just concerned that maybe he had a fracture.  He does have a wrist splint in place.  Pain only initiated with certain movements of the wrist.  He has been icing the wrist.  Denies any numbness, tingling or loss of sensation.   Wrist Pain    Past Medical History:  Diagnosis Date   Carotid artery dissection ([facility])    Cataract    Hyperlipidemia    Hypertension     There are no active problems to display for this patient.   Past Surgical History:  Procedure Laterality Date   Herniated Disc     ROTATOR CUFF REPAIR         Home Medications    Prior to Admission medications   Medication Sig Start Date End Date Taking? Authorizing Provider  aspirin EC 81 MG tablet Take 81 mg by mouth daily.    [provider]  Naftifine HCl 2 % CREA Apply topically daily as needed.    [provider]  OVER THE COUNTER MEDICATION B-12 injection once monthly.    [provider]  OVER THE COUNTER MEDICATION Vitamin D 3 one capsule daily.    [provider]  rosuvastatin (CRESTOR) 10 MG tablet Take 10 mg by mouth daily.    [provider]  tadalafil (CIALIS) 5 MG tablet Take 5 mg by mouth daily as needed for erectile dysfunction.    [provider]  valsartan (DIOVAN) 320 MG tablet Take 320 mg by mouth daily.    [provider]    Family History Family History  Problem Relation Age of Onset   Breast cancer Mother    Colon polyps Mother    Colon polyps Father    Colon cancer Neg Hx    Esophageal cancer Neg Hx    Rectal cancer Neg Hx    Stomach cancer Neg Hx     Social  History Social History   Tobacco Use   Smoking status: Never   Smokeless tobacco: Never  Substance Use Topics   Alcohol use: Yes    Alcohol/week: 2.0 standard drinks of alcohol    Types: 1 Glasses of wine, 1 Cans of beer per week    Comment: Socially   Drug use: Never     Allergies   Lipitor [atorvastatin calcium]   Review of Systems Review of Systems See HPI  Physical Exam Triage Vital Signs ED Triage Vitals [01/14/24 0922]  Encounter Vitals Group     BP 124/77     Girls Systolic BP Percentile      Girls Diastolic BP Percentile      Boys Systolic BP Percentile      Boys Diastolic BP Percentile      Pulse Rate 67     Resp 16     Temp 98 F (36.7 C)     Temp Source Oral     SpO2 95 %     Weight      Height      Head Circumference      Peak Flow  Pain Score 0     Pain Loc      Pain Education      Exclude from Growth Chart    No data found.  Updated Vital Signs BP 124/77 (BP Location: Right Arm)   Pulse 67   Temp 98 F (36.7 C) (Oral)   Resp 16   SpO2 95%   Visual Acuity Right Eye Distance:   Left Eye Distance:   Bilateral Distance:    Right Eye Near:   Left Eye Near:    Bilateral Near:     Physical Exam Constitutional:      Appearance: Normal appearance.  Pulmonary:     Effort: Pulmonary effort is normal.   Musculoskeletal:        General: Normal range of motion.     Comments: Normal range of motion of the wrist with no obvious swelling, bruising or deformities.  2+ radial pulse   Neurological:     Mental Status: He is alert.   Psychiatric:        Mood and Affect: Mood normal.      UC Treatments / Results  Labs (all labs ordered are listed, but only abnormal results are displayed) Labs Reviewed - No data to display  EKG   Radiology DG Wrist Complete Right Result Date: 01/14/2024 CLINICAL DATA:  Fall, right wrist pain. EXAM: RIGHT WRIST - COMPLETE 3+ VIEW COMPARISON:  None Available. FINDINGS: Slight widening of the  scapholunate interval is of uncertain acuity. No fracture. Slight ulnar minus deformity. Minimal chondrocalcinosis in the triangular fibrocartilage complex. Mild degenerative changes in the scaphoid trapezium trapezoid and first carpometacarpal joints. IMPRESSION: 1. Widening of the scapholunate interval is of uncertain acuity. Otherwise, no fracture. 2. Slight ulnar minus deformity. 3. 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 Electronically Signed   By: Newell Eke M.D.   On: 01/14/2024 10:09    Procedures Procedures (including critical care time)  Medications Ordered in UC Medications - No data to display  Initial Impression / Assessment and Plan / UC Course  I have reviewed the triage vital signs and the nursing notes.  Pertinent labs & imaging results that were available during my care of the patient were reviewed by me and considered in my medical decision making (see chart for details).     Fall with wrist pain-x-ray without any acute findings.  He did have some osteoarthritis which could be flared up from the fall. Recommend continue to wear the splint and ice the wrist.  He can take ibuprofen for pain and inflammation as needed. Follow-up for any continued issues Final Clinical Impressions(s) / UC Diagnoses   Final diagnoses:  Fall, initial encounter     Discharge Instructions      I am not seeing anything obvious on the xray.  If the radiologist reads anything different I will call you.  Continue to wear the splint. Ice the wrist.  Follow up as needed.     ED Prescriptions   None    PDMP not reviewed this encounter.   Adah Wilbert LABOR, FNP 01/15/24 1130
